# Patient Record
Sex: Male | Born: 2000
Health system: Southern US, Community
[De-identification: ages and names within clinical notes are randomized; demographics above are authoritative.]

## PROBLEM LIST (undated history)

## (undated) DIAGNOSIS — F909 Attention-deficit hyperactivity disorder, unspecified type: Secondary | ICD-10-CM

## (undated) DIAGNOSIS — Z8669 Personal history of other diseases of the nervous system and sense organs: Secondary | ICD-10-CM

## (undated) HISTORY — PX: TYMPANOSTOMY TUBE PLACEMENT: SHX32

## (undated) HISTORY — PX: TONSILLECTOMY: SUR1361

---
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
## 2019-03-20 ENCOUNTER — Emergency Department (INDEPENDENT_AMBULATORY_CARE_PROVIDER_SITE_OTHER)
Admission: EM | Admit: 2019-03-20 | Discharge: 2019-03-20 | Disposition: A | Discharge status: Home / Self Care | Payer: Commercial Managed Care - PPO | Source: Home / Self Care

## 2019-03-20 ENCOUNTER — Other Ambulatory Visit: Payer: Self-pay

## 2019-03-20 ENCOUNTER — Encounter: Payer: Self-pay | Admitting: Emergency Medicine

## 2019-03-20 DIAGNOSIS — H65193 Other acute nonsuppurative otitis media, bilateral: Secondary | ICD-10-CM

## 2019-03-20 MED ORDER — AMOXICILLIN 500 MG PO CAPS: 500.0000 mg | ORAL_CAPSULE | Freq: Three times a day (TID) | ORAL | 0 refills | Status: DC

## 2019-03-20 NOTE — ED Triage Notes (Signed)
Bilateral ear pain x 3-4 days   

## 2019-03-20 NOTE — ED Provider Notes (Signed)
[facility]    CSN: [id number] Arrival date & time: 03/20/19  1415     History   Chief Complaint Chief Complaint  Patient presents with   Otalgia    HPI Franklin Bowman is a 18 y.Bowman. male.   HPI  Franklin Bowman is a 18 y.Bowman. male presenting to UC with c/Bowman bilateral ear fullness and mild sharp pain on occasion for about 5-7 days, gradually worsening. Hx of ear infections in the past, symptoms feel similar. Denies fever, chills, n/v/d. He has taken Zyrtec w/Bowman relief. He has had multiple sets of ear tubes in the past due to ear infections.    Past Medical History:  Diagnosis Date   ADHD (attention deficit hyperactivity disorder)     Patient Active Problem List   Diagnosis Date Noted   Carpal tunnel syndrome on right 07/18/2015   Sinus tachycardia 07/18/2015    Past Surgical History:  Procedure Laterality Date   TONSILLECTOMY         Home Medications    Prior to Admission medications   Medication Sig Start Date End Date Taking? Authorizing Provider  cetirizine (ZYRTEC) 10 MG tablet Take 10 mg by mouth daily.   Yes [provider]  amoxicillin (AMOXIL) 500 MG capsule Take 1 capsule (500 mg total) by mouth 3 (three) times daily. 03/20/19   Franklin Bowman, Franklin Lor Bowman, Franklin Bowman    Family History History reviewed. No pertinent family history.  Social History Social History   Tobacco Use   Smoking status: Never Smoker   Smokeless tobacco: Never Used  Substance Use Topics   Alcohol use: No   Drug use: Never     Allergies   Labetalol hcl   Review of Systems Review of Systems  Constitutional: Negative for chills and fever.  HENT: Positive for congestion (minimal), ear pain ( bilateral) and hearing loss (muffled in both ears). Negative for sore throat, trouble swallowing and voice change.   Respiratory: Negative for cough and shortness of breath.   Cardiovascular: Negative for chest pain and palpitations.  Gastrointestinal: Negative for abdominal  pain, diarrhea, nausea and vomiting.  Musculoskeletal: Negative for arthralgias, back pain and myalgias.  Skin: Negative for rash.     Physical Exam Triage Vital Signs ED Triage Vitals  Enc Vitals Group     BP      Pulse      Resp      Temp      Temp src      SpO2      Weight      Height      Head Circumference      Peak Flow      Pain Score      Pain Loc      Pain Edu?      Excl. in [place]?    No data found.  Updated Vital Signs BP 139/79 (BP Location: Right Arm)    Pulse 94    Temp 98.4 F (36.9 C) (Oral)    Ht 5\' 10"  (1.778 m)    Wt 255 lb (115.7 kg)    SpO2 98%    BMI 36.59 kg/m   Visual Acuity Right Eye Distance:   Left Eye Distance:   Bilateral Distance:    Right Eye Near:   Left Eye Near:    Bilateral Near:     Physical Exam Vitals signs and nursing note reviewed.  Constitutional:      Appearance: Normal appearance. He is well-developed.  HENT:  Head: Normocephalic and atraumatic.     Right Ear: A middle ear effusion is present. Tympanic membrane is scarred. Tympanic membrane is not erythematous or bulging.     Left Ear: A middle ear effusion is present. Tympanic membrane is injected and scarred. Tympanic membrane is not erythematous or bulging.     Nose: Nose normal.     Right Sinus: No maxillary sinus tenderness or frontal sinus tenderness.     Left Sinus: No maxillary sinus tenderness or frontal sinus tenderness.     Mouth/Throat:     Lips: Pink.     Mouth: Mucous membranes are moist.     Pharynx: Oropharynx is clear. Uvula midline.  Neck:     Musculoskeletal: Normal range of motion.  Cardiovascular:     Rate and Rhythm: Normal rate and regular rhythm.  Pulmonary:     Effort: Pulmonary effort is normal. No respiratory distress.     Breath sounds: Normal breath sounds. No stridor. No wheezing or rhonchi.  Musculoskeletal: Normal range of motion.  Skin:    General: Skin is warm and dry.  Neurological:     Mental Status: He is alert and  oriented to person, place, and time.  Psychiatric:        Behavior: Behavior normal.      UC Treatments / Results  Labs (all labs ordered are listed, but only abnormal results are displayed) Labs Reviewed - No data to display  EKG None  Radiology No results found.  Procedures Procedures (including critical care time)  Medications Ordered in UC Medications - No data to display  Initial Impression / Assessment and Plan / UC Course  I have reviewed the triage vital signs and the nursing notes.  Pertinent labs & imaging results that were available during my care of the patient were reviewed by me and considered in my medical decision making (see chart for details).     Hx and exam c/w bilateral AOM Will start pt on amoxicillin Home care info provided.  Final Clinical Impressions(s) / UC Diagnoses   Final diagnoses:  Acute middle ear effusion, bilateral     Discharge Instructions      Please take antibiotics as prescribed and be sure to complete entire course even if you start to feel better to ensure infection does not come back.  You may take 500mg  acetaminophen every 4-6 hours or in combination with ibuprofen 400-600mg  every 6-8 hours as needed for pain, inflammation, and fever.  Be sure to well hydrated with clear liquids and get at least 8 hours of sleep at night, preferably more while sick.   Please follow up with family medicine in 1 week if needed.     ED Prescriptions    Medication Sig Dispense Auth. Provider   amoxicillin (AMOXIL) 500 MG capsule  (Status: Discontinued) Take 1 capsule (500 mg total) by mouth 3 (three) times daily. 21 capsule Franklin Bowman, Franklin Arvin Bowman, Franklin Bowman   amoxicillin (AMOXIL) 500 MG capsule Take 1 capsule (500 mg total) by mouth 3 (three) times daily. 21 capsule Franklin Bowman, Franklin Bowman     Controlled Substance Prescriptions [place] Controlled Substance Registry consulted? Not Applicable   Franklin Bowman 03/20/19 1441

## 2019-03-20 NOTE — Discharge Instructions (Signed)

## 2019-04-30 ENCOUNTER — Other Ambulatory Visit: Payer: Self-pay

## 2019-04-30 ENCOUNTER — Encounter: Payer: Self-pay | Admitting: Emergency Medicine

## 2019-04-30 ENCOUNTER — Emergency Department (INDEPENDENT_AMBULATORY_CARE_PROVIDER_SITE_OTHER)
Admission: EM | Admit: 2019-04-30 | Discharge: 2019-04-30 | Disposition: A | Discharge status: Home / Self Care | Payer: Commercial Managed Care - PPO | Source: Home / Self Care | Attending: Family Medicine | Admitting: Family Medicine

## 2019-04-30 DIAGNOSIS — R110 Nausea: Secondary | ICD-10-CM | POA: Diagnosis not present

## 2019-04-30 DIAGNOSIS — R197 Diarrhea, unspecified: Secondary | ICD-10-CM

## 2019-04-30 NOTE — Discharge Instructions (Signed)
°  Be sure to get a lot of rest and stay well hydrated with sports drinks, water, diluted juices, and clear sodas.  Avoid fried fatty food, spicy food, and milk as these foods can cause worsening stomach upset.   Please follow up with family medicine later this week as needed.

## 2019-04-30 NOTE — ED Triage Notes (Signed)
Patient states he had [place] food last evening and later he had 2-3 times; no nausea; no vomiting; no fever. Today he has some epigastric tenderness and mild headache and he has not tried any OTC. He has not travelled past 4 weeks; he works with public and uses mask.

## 2019-04-30 NOTE — ED Provider Notes (Signed)
[facility]    CSN: [id number] Arrival date & time: 04/30/19  1706     History   Chief Complaint Chief Complaint  Patient presents with  . Diarrhea  . Headache    HPI Franklin Bowman is a 18 y.o. male.   HPI Franklin Bowman is a 18 y.o. male presenting to UC with c/o having 2-3 episodes of diarrhea last night after eating [place] food. He did take peptobismol last night with relief. This morning he had to call out of work due to having a mild headache and some epigastric discomfort.  Pt denies nausea or vomiting. No medication taken today. He was able to eat [employer] earlier today and did not have n/v/d after eating lunch.  Denies fever, chills, cough, congestion. No diarrhea today. No sick contacts or recent travel.    Past Medical History:  Diagnosis Date  . ADHD (attention deficit hyperactivity disorder)     Patient Active Problem List   Diagnosis Date Noted  . Carpal tunnel syndrome on right 07/18/2015  . Sinus tachycardia 07/18/2015    Past Surgical History:  Procedure Laterality Date  . TONSILLECTOMY         Home Medications    Prior to Admission medications   Medication Sig Start Date End Date Taking? Authorizing Provider  amoxicillin (AMOXIL) 500 MG capsule Take 1 capsule (500 mg total) by mouth 3 (three) times daily. 03/20/19   Noe Gens, PA-C  cetirizine (ZYRTEC) 10 MG tablet Take 10 mg by mouth daily.    [provider]    Family History No family history on file.  Social History Social History   Tobacco Use  . Smoking status: Never Smoker  . Smokeless tobacco: Never Used  Substance Use Topics  . Alcohol use: No  . Drug use: Never     Allergies   Labetalol hcl   Review of Systems Review of Systems  Constitutional: Negative for appetite change, chills, fatigue and fever.  HENT: Negative for congestion.   Respiratory: Negative for cough and chest tightness.   Gastrointestinal: Positive for abdominal pain and  diarrhea. Negative for blood in stool, constipation, nausea and vomiting.  Genitourinary: Negative for dysuria, flank pain, frequency and hematuria.  Neurological: Positive for headaches. Negative for dizziness and light-headedness.     Physical Exam Triage Vital Signs ED Triage Vitals  Enc Vitals Group     BP 04/30/19 1736 (!) 148/86     Pulse Rate 04/30/19 1736 84     Resp 04/30/19 1736 16     Temp 04/30/19 1736 98.4 F (36.9 C)     Temp Source 04/30/19 1736 Oral     SpO2 04/30/19 1736 98 %     Weight 04/30/19 1737 253 lb 8.5 oz (115 kg)     Height 04/30/19 1737 5\' 10"  (1.778 m)     Head Circumference --      Peak Flow --      Pain Score 04/30/19 1737 1     Pain Loc --      Pain Edu? --      Excl. in [place]? --    No data found.  Updated Vital Signs BP (!) 148/86 (BP Location: Right Arm)   Pulse 84   Temp 98.4 F (36.9 C) (Oral)   Resp 16   Ht 5\' 10"  (1.778 m)   Wt 253 lb 8.5 oz (115 kg)   SpO2 98%   BMI 36.38 kg/m   Visual Acuity Right Eye  Distance:   Left Eye Distance:   Bilateral Distance:    Right Eye Near:   Left Eye Near:    Bilateral Near:     Physical Exam Vitals signs and nursing note reviewed.  Constitutional:      Appearance: He is well-developed.  HENT:     Head: Normocephalic and atraumatic.     Right Ear: Tympanic membrane normal.     Left Ear: Tympanic membrane normal.     Nose: Nose normal.     Right Sinus: No maxillary sinus tenderness or frontal sinus tenderness.     Left Sinus: No maxillary sinus tenderness or frontal sinus tenderness.     Mouth/Throat:     Lips: Pink.     Mouth: Mucous membranes are moist.     Pharynx: Oropharynx is clear. Uvula midline.  Neck:     Musculoskeletal: Normal range of motion and neck supple.  Cardiovascular:     Rate and Rhythm: Normal rate and regular rhythm.  Pulmonary:     Effort: Pulmonary effort is normal. No respiratory distress.     Breath sounds: Normal breath sounds.  Abdominal:      General: There is no distension.     Palpations: Abdomen is soft.     Tenderness: There is no abdominal tenderness.  Musculoskeletal: Normal range of motion.  Skin:    General: Skin is warm and dry.  Neurological:     Mental Status: He is alert and oriented to person, place, and time.  Psychiatric:        Behavior: Behavior normal.      UC Treatments / Results  Labs (all labs ordered are listed, but only abnormal results are displayed) Labs Reviewed - No data to display  EKG   Radiology No results found.  Procedures Procedures (including critical care time)  Medications Ordered in UC Medications - No data to display  Initial Impression / Assessment and Plan / UC Course  I have reviewed the triage vital signs and the nursing notes.  Pertinent labs & imaging results that were available during my care of the patient were reviewed by me and considered in my medical decision making (see chart for details).     Pt reports 2-3 episodes of diarrhea last night, which has since resolved. Pt requesting work note benign abdominal exam. Encouraged good hydration. Work note provided.  Final Clinical Impressions(s) / UC Diagnoses   Final diagnoses:  Nausea without vomiting  Diarrhea, unspecified type     Discharge Instructions      Be sure to get a lot of rest and stay well hydrated with sports drinks, water, diluted juices, and clear sodas.  Avoid fried fatty food, spicy food, and milk as these foods can cause worsening stomach upset.   Please follow up with family medicine later this week as needed.    ED Prescriptions    None     Controlled Substance Prescriptions  Controlled Substance Registry consulted? Not Applicable   Rolla Platehelps, Versia Mignogna O, PA-C 05/01/19 [id number]

## 2019-05-02 ENCOUNTER — Other Ambulatory Visit: Payer: Self-pay

## 2019-05-02 ENCOUNTER — Emergency Department (INDEPENDENT_AMBULATORY_CARE_PROVIDER_SITE_OTHER)
Admission: EM | Admit: 2019-05-02 | Discharge: 2019-05-02 | Disposition: A | Discharge status: Home / Self Care | Payer: Commercial Managed Care - PPO | Source: Home / Self Care | Attending: Emergency Medicine | Admitting: Emergency Medicine

## 2019-05-02 ENCOUNTER — Emergency Department (INDEPENDENT_AMBULATORY_CARE_PROVIDER_SITE_OTHER): Payer: Commercial Managed Care - PPO

## 2019-05-02 DIAGNOSIS — S9031XA Contusion of right foot, initial encounter: Secondary | ICD-10-CM

## 2019-05-02 DIAGNOSIS — S93401A Sprain of unspecified ligament of right ankle, initial encounter: Secondary | ICD-10-CM

## 2019-05-02 DIAGNOSIS — M79671 Pain in right foot: Secondary | ICD-10-CM | POA: Diagnosis not present
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
## 2019-05-02 NOTE — Discharge Instructions (Signed)
Diagnosis is right ankle sprain and right foot contusion.  No evidence of fracture on x-rays. Treatment: Please see attached instruction sheets on foot contusion and ankle sprain. Ice and elevate for next 1 to 2 days.  Then may try heat.  May use Ace bandage for 1 to 2 days. Wear the right ankle brace that were applying today.  Use crutches that you have.  Over the next few days may gradually try to increase walking a little bit. I know you have meloxicam 15 mg at home, may take this once a day with food for moderate pain. I am printing a separate work note, excusing from work for 1 week, as you are required to be on your feet all day.

## 2019-06-18 ENCOUNTER — Other Ambulatory Visit: Payer: Self-pay

## 2019-06-18 ENCOUNTER — Emergency Department (INDEPENDENT_AMBULATORY_CARE_PROVIDER_SITE_OTHER)
Admission: EM | Admit: 2019-06-18 | Discharge: 2019-06-18 | Disposition: A | Discharge status: Home / Self Care | Payer: Commercial Managed Care - PPO | Source: Home / Self Care | Attending: Family Medicine | Admitting: Family Medicine

## 2019-06-18 DIAGNOSIS — R110 Nausea: Secondary | ICD-10-CM | POA: Diagnosis not present

## 2019-06-18 MED ORDER — ONDANSETRON 4 MG PO TBDP: ORAL_TABLET | ORAL | 0 refills | Status: DC

## 2019-06-18 NOTE — Discharge Instructions (Signed)
Isolate yourself until the below conditions are met: 1)  At least 7 days since symptoms onset. AND 2)  > 72 hours after symptom resolution (absence of fever without the use of fever-reducing medicine, and improvement in any respiratory symptoms.  If COVID test negative, may resume activities as tolerated.. If symptoms become significantly worse during the night or over the weekend, proceed to the local emergency room.

## 2019-06-18 NOTE — ED Triage Notes (Signed)
Headache and nausea for the last few days.  No emesis.  Fatigued.  Denies fever or cough

## 2019-06-18 NOTE — ED Provider Notes (Signed)
[facility]    CSN: [id number] Arrival date & time: 06/18/19  1057      History   Chief Complaint Chief Complaint  Patient presents with  . Nausea  . Headache    HPI Franklin Bowman is a 18 y.o. male.   Patient developed nausea without vomiting three days ago, followed by mild headache and fatigue. He denies cough or respiratory symptoms, and no changes in taste/smell.  He feels better today.  The history is provided by the patient.    Past Medical History:  Diagnosis Date  . ADHD (attention deficit hyperactivity disorder)     Patient Active Problem List   Diagnosis Date Noted  . Carpal tunnel syndrome on right 07/18/2015  . Sinus tachycardia 07/18/2015    Past Surgical History:  Procedure Laterality Date  . TONSILLECTOMY         Home Medications    Prior to Admission medications   Medication Sig Start Date End Date Taking? Authorizing Provider  ondansetron (ZOFRAN ODT) 4 MG disintegrating tablet Take one tab by mouth Q6hr prn nausea.  Dissolve under tongue. 06/18/19   Kandra Nicolas, MD    Family History History reviewed. No pertinent family history.  Social History Social History   Tobacco Use  . Smoking status: Never Smoker  . Smokeless tobacco: Never Used  Substance Use Topics  . Alcohol use: No  . Drug use: Never     Allergies   Labetalol hcl   Review of Systems Review of Systems + sore throat, resolved No cough No pleuritic pain No wheezing No nasal congestion No post-nasal drainage No sinus pain/pressure No itchy/red eyes No earache No hemoptysis No SOB No fever/chills + nausea No vomiting No abdominal pain No diarrhea No urinary symptoms No skin rash + fatigue No myalgias + headache   Physical Exam Triage Vital Signs ED Triage Vitals  Enc Vitals Group     BP 06/18/19 1201 (!) 147/86     Pulse Rate 06/18/19 1201 99     Resp 06/18/19 1201 20     Temp 06/18/19 1201 97.7 F (36.5 C)     Temp Source  06/18/19 1201 Oral     SpO2 06/18/19 1201 99 %     Weight 06/18/19 1202 252 lb (114.3 kg)     Height 06/18/19 1202 _0  (1.778 m)     Head Circumference --      Peak Flow --      Pain Score 06/18/19 1202 4     Pain Loc --      Pain Edu? --      Excl. in [place]? --    No data found.  Updated Vital Signs BP (!) 147/86 (BP Location: Right Arm)   Pulse 99   Temp 97.7 F (36.5 C) (Oral)   Resp 20   Ht _1  (1.778 m)   Wt 114.3 kg   SpO2 99%   BMI 36.16 kg/m   Visual Acuity Right Eye Distance:   Left Eye Distance:   Bilateral Distance:    Right Eye Near:   Left Eye Near:    Bilateral Near:     Physical Exam Nursing notes and Vital Signs reviewed. Appearance:  Patient appears stated age, and in no acute distress Eyes:  Pupils are equal, round, and reactive to light and accomodation.  Extraocular movement is intact.  Conjunctivae are not inflamed  Ears:  Canals normal.  Tympanic membranes normal.  Nose:  Mildly congested turbinates.  No sinus tenderness. Pharynx:  Normal; moist mucous membranes  Neck:  Supple. No adenopathy   Lungs:  Clear to auscultation.  Breath sounds are equal.  Moving air well. Heart:  Regular rate and rhythm without murmurs, rubs, or gallops.  Abdomen:  Nontender without masses or hepatosplenomegaly.  Bowel sounds are present.  No CVA or flank tenderness.  Extremities:  No edema.  Skin:  No rash present.    UC Treatments / Results  Labs (all labs ordered are listed, but only abnormal results are displayed) Labs Reviewed  NOVEL CORONAVIRUS, NAA    EKG   Radiology No results found.  Procedures Procedures (including critical care time)  Medications Ordered in UC Medications - No data to display  Initial Impression / Assessment and Plan / UC Course  I have reviewed the triage vital signs and the nursing notes.  Pertinent labs & imaging results that were available during my care of the patient were reviewed by me and considered in my  medical decision making (see chart for details).    Suspect viral syndrome.  COVID19 test pending. Rx for Zofran ODT 56m.   Final Clinical Impressions(s) / UC Diagnoses   Final diagnoses:  Nausea without vomiting     Discharge Instructions      Isolate yourself until the below conditions are met: 1)  At least 7 days since symptoms onset. AND 2)  > 72 hours after symptom resolution (absence of fever without the use of fever-reducing medicine, and improvement in any respiratory symptoms.  If COVID test negative, may resume activities as tolerated.. If symptoms become significantly worse during the night or over the weekend, proceed to the local emergency room.      ED Prescriptions    Medication Sig Dispense Auth. Provider   ondansetron (ZOFRAN ODT) 4 MG disintegrating tablet Take one tab by mouth Q6hr prn nausea.  Dissolve under tongue. 12 tablet BKandra Nicolas MD        BKandra Nicolas MD 06/21/19 [id number]

## 2019-06-19 LAB — NOVEL CORONAVIRUS, NAA: SARS-CoV-2, NAA: NOT DETECTED

## 2019-06-25 ENCOUNTER — Encounter: Payer: Self-pay | Admitting: *Deleted

## 2019-06-25 ENCOUNTER — Emergency Department (INDEPENDENT_AMBULATORY_CARE_PROVIDER_SITE_OTHER)
Admission: EM | Admit: 2019-06-25 | Discharge: 2019-06-25 | Disposition: A | Discharge status: Home / Self Care | Payer: Commercial Managed Care - PPO | Source: Home / Self Care

## 2019-06-25 ENCOUNTER — Other Ambulatory Visit: Payer: Self-pay

## 2019-06-25 DIAGNOSIS — R197 Diarrhea, unspecified: Secondary | ICD-10-CM

## 2019-06-25 DIAGNOSIS — L298 Other pruritus: Secondary | ICD-10-CM

## 2019-06-25 DIAGNOSIS — R519 Headache, unspecified: Secondary | ICD-10-CM

## 2019-06-25 DIAGNOSIS — J029 Acute pharyngitis, unspecified: Secondary | ICD-10-CM

## 2019-06-25 DIAGNOSIS — R110 Nausea: Secondary | ICD-10-CM

## 2019-06-25 LAB — POCT RAPID STREP A (OFFICE): Rapid Strep A Screen: NEGATIVE

## 2019-06-25 MED ORDER — TRIAMCINOLONE ACETONIDE 0.1 % EX CREA: 1.0000 "application " | TOPICAL_CREAM | Freq: Two times a day (BID) | CUTANEOUS | 0 refills | Status: DC

## 2019-06-25 NOTE — ED Triage Notes (Signed)
Pt c/o sore throat, nausea, rash on RT forearm, diarrhea, HA, and fatigue x 06/15/19.

## 2019-06-25 NOTE — Discharge Instructions (Signed)
°  You may take 500mg  acetaminophen every 4-6 hours or in combination with ibuprofen 400-600mg  every 6-8 hours as needed for pain, inflammation, and fever.  Be sure to well hydrated with clear liquids and get at least 8 hours of sleep at night, preferably more while sick.   Please follow up with family medicine in 1 week if needed.  Be sure to get a lot of rest and stay well hydrated with sports drinks, water, diluted juices, and clear sodas.  Avoid fried fatty food, spicy food, and milk as these foods can cause worsening stomach upset.   No Primary Care Doctor: Call [facility] at  [phone] - they can help you locate a primary care doctor that  accepts your insurance, provides certain services, etc. Physician Referral Service- [phone]

## 2019-06-25 NOTE — ED Provider Notes (Signed)
[facility]    CSN: [id number] Arrival date & time: 06/25/19  1002      History   Chief Complaint Chief Complaint  Patient presents with  . Sore Throat  . Nausea    HPI Franklin Bowman is a 18 y.o. male.   HPI Franklin Bowman is a 18 y.o. male presenting to UC with c/o sore throat that started 2 days ago along with a mildly itchy red rash on his Right forearm. He was seen at [facility] on [facility]/18/2019, tested negative for Covid-19 at that time.  He was only having nausea, fatigue, and a mild HA at that time so a strep test was not ordered then.  The zofran prescribed has helped but he reports having diarrhea the last few days and continued fatigue. Pt concerned his covid test was not accurate. Denies fever, chills, cough, congestion, or vomiting. He has been taking pepto-bismol and notes his stool has been darker than usual. No sick contacts or recent travel.   Past Medical History:  Diagnosis Date  . ADHD (attention deficit hyperactivity disorder)     Patient Active Problem List   Diagnosis Date Noted  . Carpal tunnel syndrome on right 07/18/2015  . Sinus tachycardia 07/18/2015    Past Surgical History:  Procedure Laterality Date  . TONSILLECTOMY         Home Medications    Prior to Admission medications   Medication Sig Start Date End Date Taking? Authorizing Provider  ondansetron (ZOFRAN ODT) 4 MG disintegrating tablet Take one tab by mouth Q6hr prn nausea.  Dissolve under tongue. 06/18/19   Kandra Nicolas, MD  triamcinolone cream (KENALOG) 0.1 % Apply 1 application topically 2 (two) times daily. 06/25/19   Noe Gens, PA-C    Family History History reviewed. No pertinent family history.  Social History Social History   Tobacco Use  . Smoking status: Never Smoker  . Smokeless tobacco: Never Used  Substance Use Topics  . Alcohol use: No  . Drug use: Never     Allergies   Labetalol hcl   Review of Systems Review of Systems  Constitutional:  Positive for fatigue. Negative for chills and fever.  HENT: Positive for sore throat. Negative for congestion, ear pain, trouble swallowing and voice change.   Respiratory: Negative for cough and shortness of breath.   Cardiovascular: Negative for chest pain and palpitations.  Gastrointestinal: Positive for diarrhea and nausea. Negative for abdominal pain and vomiting.  Musculoskeletal: Negative for arthralgias, back pain and myalgias.  Skin: Positive for rash.  Neurological: Positive for headaches. Negative for dizziness and light-headedness.     Physical Exam Triage Vital Signs ED Triage Vitals  Enc Vitals Group     BP 06/25/19 1015 (!) 143/84     Pulse Rate 06/25/19 1015 80     Resp 06/25/19 1015 18     Temp 06/25/19 1015 98.9 F (37.2 C)     Temp Source 06/25/19 1015 Oral     SpO2 06/25/19 1015 98 %     Weight 06/25/19 1014 252 lb (114.3 kg)     Height 06/25/19 1014 5\' 10"  (1.778 m)     Head Circumference --      Peak Flow --      Pain Score 06/25/19 1014 0     Pain Loc --      Pain Edu? --      Excl. in [place]? --    No data found.  Updated Vital Signs BP Marland Kitchen)  143/84 (BP Location: Right Arm)   Pulse 80   Temp 98.9 F (37.2 C) (Oral)   Resp 18   Ht 5\' 10"  (1.778 m)   Wt 252 lb (114.3 kg)   SpO2 98%   BMI 36.16 kg/m   Visual Acuity Right Eye Distance:   Left Eye Distance:   Bilateral Distance:    Right Eye Near:   Left Eye Near:    Bilateral Near:     Physical Exam Vitals signs and nursing note reviewed.  Constitutional:      General: He is not in acute distress.    Appearance: He is well-developed. He is not ill-appearing, toxic-appearing or diaphoretic.  HENT:     Head: Normocephalic and atraumatic.     Right Ear: Tympanic membrane normal.     Left Ear: Tympanic membrane normal.     Nose: Nose normal.     Right Sinus: No maxillary sinus tenderness or frontal sinus tenderness.     Left Sinus: No maxillary sinus tenderness or frontal sinus tenderness.      Mouth/Throat:     Lips: Pink.     Mouth: Mucous membranes are moist.     Pharynx: Oropharynx is clear. Uvula midline. No pharyngeal swelling, oropharyngeal exudate, posterior oropharyngeal erythema or uvula swelling.  Neck:     Musculoskeletal: Normal range of motion and neck supple.  Cardiovascular:     Rate and Rhythm: Normal rate and regular rhythm.  Pulmonary:     Effort: Pulmonary effort is normal. No respiratory distress.     Breath sounds: Normal breath sounds. No stridor. No wheezing or rhonchi.  Abdominal:     General: There is no distension.     Palpations: Abdomen is soft.     Tenderness: There is no abdominal tenderness.  Musculoskeletal: Normal range of motion.  Lymphadenopathy:     Cervical: No cervical adenopathy.  Skin:    General: Skin is warm and dry.     Findings: Rash present.       Neurological:     Mental Status: He is alert and oriented to person, place, and time.  Psychiatric:        Behavior: Behavior normal.      UC Treatments / Results  Labs (all labs ordered are listed, but only abnormal results are displayed) Labs Reviewed  STREP A DNA PROBE  POCT RAPID STREP A (OFFICE)    EKG   Radiology No results found.  Procedures Procedures (including critical care time)  Medications Ordered in UC Medications - No data to display  Initial Impression / Assessment and Plan / UC Course  I have reviewed the triage vital signs and the nursing notes.  Pertinent labs & imaging results that were available during my care of the patient were reviewed by me and considered in my medical decision making (see chart for details).     Reassured pt of normal strep Discussed mono given HA, fatigue and sore throat- no hx of mono, pt declined further testing. Encouraged symptomatic tx F/u with PCP as needed.   Final Clinical Impressions(s) / UC Diagnoses   Final diagnoses:  Acute pharyngitis, unspecified etiology  Nausea without vomiting  Generalized  headache  Pruritic erythematous rash  Diarrhea, unspecified type     Discharge Instructions      You may take 500mg  acetaminophen every 4-6 hours or in combination with ibuprofen 400-600mg  every 6-8 hours as needed for pain, inflammation, and fever.  Be sure to well hydrated with clear  liquids and get at least 8 hours of sleep at night, preferably more while sick.   Please follow up with family medicine in 1 week if needed.  Be sure to get a lot of rest and stay well hydrated with sports drinks, water, diluted juices, and clear sodas.  Avoid fried fatty food, spicy food, and milk as these foods can cause worsening stomach upset.   No Primary Care Doctor: - Call [facility] at  [phone] - they can help you locate a primary care doctor that  accepts your insurance, provides certain services, etc. - Physician Referral [phone]- [phone]      ED Prescriptions    Medication Sig Dispense Auth. Provider   triamcinolone cream (KENALOG) 0.1 % Apply 1 application topically 2 (two) times daily. 30 g Lurene ShadowPhelps, Amylia Collazos O, [place]-C     PDMP not reviewed this encounter.   Lurene Shadowhelps, Michon Kaczmarek O, [place]-C 06/25/19 1240

## 2019-06-27 LAB — STREP A DNA PROBE: Group A Strep Probe: NOT DETECTED

## 2020-01-07 ENCOUNTER — Other Ambulatory Visit: Payer: Self-pay

## 2020-01-07 ENCOUNTER — Encounter: Payer: Self-pay | Admitting: Emergency Medicine

## 2020-01-07 ENCOUNTER — Emergency Department (INDEPENDENT_AMBULATORY_CARE_PROVIDER_SITE_OTHER)
Admission: EM | Admit: 2020-01-07 | Discharge: 2020-01-07 | Disposition: A | Discharge status: Home / Self Care | Payer: Commercial Managed Care - PPO | Source: Home / Self Care

## 2020-01-07 DIAGNOSIS — M546 Pain in thoracic spine: Secondary | ICD-10-CM

## 2020-01-07 MED ORDER — MELOXICAM 15 MG PO TBDP
1.0000 | ORAL_TABLET | Freq: Every day | ORAL | 0 refills | Status: DC | PRN
Start: 2020-01-07 — End: 2020-01-08

## 2020-01-07 MED ORDER — CYCLOBENZAPRINE HCL 5 MG PO TABS: 5.0000 mg | ORAL_TABLET | Freq: Two times a day (BID) | ORAL | 0 refills | Status: DC | PRN

## 2020-01-07 NOTE — Discharge Instructions (Signed)
°  Flexeril (cyclobenzaprine) is a muscle relaxer and may cause drowsiness. Do not drink alcohol, drive, or operate heavy machinery while taking. ° °Meloxicam (Mobic) is an antiinflammatory to help with pain and inflammation.  Do not take ibuprofen, Advil, Aleve, or any other medications that contain NSAIDs while taking meloxicam as this may cause stomach upset or even ulcers if taken in large amounts for an extended period of time.  ° °

## 2020-01-07 NOTE — ED Provider Notes (Signed)
[facility]    CSN: [id number] Arrival date & time: 01/07/20  1131      History   Chief Complaint Chief Complaint  Patient presents with  . Back Pain    r upper back    HPI Denvil Canning is a 19 y.o. male.   HPI Miliano Cotten is a 19 y.o. male presenting to UC with c/o Right upper back pain around his scapula that started this morning after waking up. Pain is sharp, 10/10. Denies radiation of pain or numbness in arms or legs. He is right hand dominant.  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 Pain was worse at work today, causing him to leave early.  Hx of similar pain on the left side of his back a few weeks ago. He took meloxicam at that time but was unsure if he should take the same medication today for the right side of his back. He called Dr. Dianah Field, who he's seen in the past, but no appointments were available today.    Past Medical History:  Diagnosis Date  . ADHD (attention deficit hyperactivity disorder)     Patient Active Problem List   Diagnosis Date Noted  . Carpal tunnel syndrome on right 07/18/2015  . Sinus tachycardia 07/18/2015    Past Surgical History:  Procedure Laterality Date  . TONSILLECTOMY         Home Medications    Prior to Admission medications   Medication Sig Start Date End Date Taking? Authorizing Provider  cetirizine (ZYRTEC) 10 MG tablet Take by mouth.   Yes [provider]  cyclobenzaprine (FLEXERIL) 5 MG tablet Take 1-2 tablets (5-10 mg total) by mouth 2 (two) times daily as needed for muscle spasms. 01/07/20   Noe Gens, PA-C  Meloxicam 15 MG TBDP Take 1 tablet by mouth daily as needed (pain and swelling). 01/07/20   Noe Gens, PA-C  ondansetron (ZOFRAN ODT) 4 MG disintegrating tablet Take one tab by mouth Q6hr prn nausea.  Dissolve under tongue. 06/18/19   Kandra Nicolas, MD  triamcinolone cream (KENALOG) 0.1 % Apply 1 application topically 2 (two) times daily. 06/25/19   Noe Gens,  PA-C    Family History Family History  Problem Relation Age of Onset  . Healthy Mother   . Healthy Father   . Healthy Sister     Social History Social History   Tobacco Use  . Smoking status: Never Smoker  . Smokeless tobacco: Never Used  Substance Use Topics  . Alcohol use: No  . Drug use: Never     Allergies   Labetalol hcl   Review of Systems Review of Systems  Musculoskeletal: Positive for back pain and myalgias. Negative for arthralgias, gait problem, joint swelling, neck pain and neck stiffness.  Skin: Negative for color change, rash and wound.  Neurological: Negative for weakness and numbness.     Physical Exam Triage Vital Signs ED Triage Vitals  Enc Vitals Group     BP 01/07/20 1143 135/83     Pulse Rate 01/07/20 1143 69     Resp --      Temp 01/07/20 1143 98.1 F (36.7 C)     Temp Source 01/07/20 1143 Oral     SpO2 01/07/20 1143 99 %     Weight --      Height --      Head Circumference --      Peak Flow --      Pain Score  01/07/20 1144 10     Pain Loc --      Pain Edu? --      Excl. in [place]? --    No data found.  Updated Vital Signs BP 135/83 (BP Location: Left Arm)   Pulse 69   Temp 98.1 F (36.7 C) (Oral)   SpO2 99%   Visual Acuity Right Eye Distance:   Left Eye Distance:   Bilateral Distance:    Right Eye Near:   Left Eye Near:    Bilateral Near:     Physical Exam Vitals and nursing note reviewed.  Constitutional:      Appearance: He is well-developed.  HENT:     Head: Normocephalic and atraumatic.  Neck:     Comments: No midline bone tenderness, no crepitus or step-offs.  Cardiovascular:     Rate and Rhythm: Normal rate.  Pulmonary:     Effort: Pulmonary effort is normal.  Musculoskeletal:        General: Tenderness present. Normal range of motion.     Cervical back: Normal range of motion and neck supple. No tenderness.       Back:     Comments: No midline spinal tenderness. Full ROM upper and lower extremities  with 5-5 strength. Tenderness to Right side thoracic muscles along border of scapula.   Skin:    General: Skin is warm and dry.  Neurological:     Mental Status: He is alert and oriented to person, place, and time.  Psychiatric:        Behavior: Behavior normal.      UC Treatments / Results  Labs (all labs ordered are listed, but only abnormal results are displayed) Labs Reviewed - No data to display  EKG   Radiology No results found.  Procedures Procedures (including critical care time)  Medications Ordered in UC Medications - No data to display  Initial Impression / Assessment and Plan / UC Course  I have reviewed the triage vital signs and the nursing notes.  Pertinent labs & imaging results that were available during my care of the patient were reviewed by me and considered in my medical decision making (see chart for details).     Hx and exam c/w thoracic muscle strain and spasms Will tx with mobic and flexeril encourgaed f/u with sports medicine as needed later this week. Work note provided for today AVS provided.  Final Clinical Impressions(s) / UC Diagnoses   Final diagnoses:  Acute right-sided thoracic back pain     Discharge Instructions      Flexeril (cyclobenzaprine) is a muscle relaxer and may cause drowsiness. Do not drink alcohol, drive, or operate heavy machinery while taking.  Meloxicam (Mobic) is an antiinflammatory to help with pain and inflammation.  Do not take ibuprofen, Advil, Aleve, or any other medications that contain NSAIDs while taking meloxicam as this may cause stomach upset or even ulcers if taken in large amounts for an extended period of time.      ED Prescriptions    Medication Sig Dispense Auth. Provider   cyclobenzaprine (FLEXERIL) 5 MG tablet Take 1-2 tablets (5-10 mg total) by mouth 2 (two) times daily as needed for muscle spasms. 30 tablet Waylan Rocher O, PA-C   Meloxicam 15 MG TBDP Take 1 tablet by mouth daily as  needed (pain and swelling). 30 tablet Lurene Shadow, [place]     I have reviewed the PDMP during this encounter.   Lurene Shadow, [place] 01/07/20 1319

## 2020-01-07 NOTE — ED Triage Notes (Signed)
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

## 2020-01-08 ENCOUNTER — Telehealth: Payer: Self-pay | Admitting: Emergency Medicine

## 2020-01-08 MED ORDER — MELOXICAM 15 MG PO TABS: 15.0000 mg | ORAL_TABLET | Freq: Every day | ORAL | 0 refills | Status: DC

## 2020-01-08 NOTE — Telephone Encounter (Signed)
Meloxicam re-ordered as it came up ODT initially for [facility]. Oral tablet intended for pt.

## 2020-02-18 ENCOUNTER — Encounter: Payer: Self-pay | Admitting: Sports Medicine

## 2020-02-18 ENCOUNTER — Ambulatory Visit (INDEPENDENT_AMBULATORY_CARE_PROVIDER_SITE_OTHER): Payer: Commercial Managed Care - PPO | Admitting: Sports Medicine

## 2020-02-18 ENCOUNTER — Other Ambulatory Visit: Payer: Self-pay

## 2020-02-18 DIAGNOSIS — M7552 Bursitis of left shoulder: Secondary | ICD-10-CM

## 2020-02-18 NOTE — Progress Notes (Signed)
    Procedures performed today:    Procedure: Real-time Ultrasound Guided injection of the left subacromial bursa Device: Samsung HS60  Verbal informed consent obtained.  Time-out conducted.  Noted no overlying erythema, induration, or other signs of local infection.  Skin prepped in a sterile fashion.  Local anesthesia: Topical Ethyl chloride.  With sterile technique and under real time ultrasound guidance: 1 cc Kenalog 40, 1 cc lidocaine,1 cc bupivacaine injected easily Completed without difficulty  Pain immediately resolved suggesting accurate placement of the medication.  Advised to call if fevers/chills, erythema, induration, drainage, or persistent bleeding.  Images permanently stored and available for review in the ultrasound unit.  Impression: Technically successful ultrasound guided injection.  Independent interpretation of notes and tests performed by another provider:   None.  Brief History, Exam, Impression, and Recommendations:    Acute shoulder bursitis, left Apolinar Junes returns he is a pleasant 19 year old male, of seen him before for other problems. Last Friday he was helping a friend change a tire, he turned and twisted and felt a sharp pain in his left shoulder. He localizes it over the deltoid and worse with abduction. He has a positive Neer, empty can, Hawkins signs on exam. He has tried meloxicam, relative rest. Today we are going to do a subacromial injection with guidance, rotator cuff rehab exercises given, light duty at work for a week, return to see me in a month.    ___________________________________________ Ihor Austin. Benjamin Stain, M.D., ABFM., CAQSM. Primary Care and Sports Medicine [facility]  Adjunct Instructor of Family Medicine  [employer]

## 2020-02-18 NOTE — Assessment & Plan Note (Signed)
Franklin Bowman returns he is a pleasant 19 year old male, of seen him before for other problems. Last Friday he was helping a friend change a tire, he turned and twisted and felt a sharp pain in his left shoulder. He localizes it over the deltoid and worse with abduction. He has a positive Neer, empty can, Hawkins signs on exam. He has tried meloxicam, relative rest. Today we are going to do a subacromial injection with guidance, rotator cuff rehab exercises given, light duty at work for a week, return to see me in a month.

## 2020-03-23 ENCOUNTER — Emergency Department (INDEPENDENT_AMBULATORY_CARE_PROVIDER_SITE_OTHER)
Admission: EM | Admit: 2020-03-23 | Discharge: 2020-03-23 | Disposition: A | Discharge status: Home / Self Care | Payer: Commercial Managed Care - PPO | Source: Home / Self Care

## 2020-03-23 ENCOUNTER — Encounter: Payer: Self-pay | Admitting: Emergency Medicine

## 2020-03-23 ENCOUNTER — Other Ambulatory Visit: Payer: Self-pay

## 2020-03-23 DIAGNOSIS — H6692 Otitis media, unspecified, left ear: Secondary | ICD-10-CM | POA: Diagnosis not present

## 2020-03-23 HISTORY — DX: Personal history of other diseases of the nervous system and sense organs: Z86.69

## 2020-03-23 MED ORDER — AMOXICILLIN-POT CLAVULANATE 875-125 MG PO TABS: 1.0000 | ORAL_TABLET | Freq: Two times a day (BID) | ORAL | 0 refills | Status: DC

## 2020-03-23 NOTE — Discharge Instructions (Signed)
  You may take 500mg acetaminophen every 4-6 hours or in combination with ibuprofen 400-600mg every 6-8 hours as needed for pain, inflammation, and fever.  Be sure to well hydrated with clear liquids and get at least 8 hours of sleep at night, preferably more while sick.   Please follow up with family medicine in 1 week if needed.   

## 2020-03-23 NOTE — ED Provider Notes (Signed)
[facility]    CSN: [id number] Arrival date & time: 03/23/20  1437      History   Chief Complaint Chief Complaint  Patient presents with  . Otalgia    bilateral    HPI Franklin Bowman is a 19 y.o. male.   HPI  Franklin Bowman is a 19 y.o. male presenting to UC with c/o bilateral ear pain that started 1 week ago after taking a shower. Pain is sharp and aching, 2/10, Left ear worsened after using a q-tip. No bleeding or drainage. Tylenol taken at 830AM this morning. Hx of chronic ear infections. No fever, chills, n/v/d.    Past Medical History:  Diagnosis Date  . ADHD (attention deficit hyperactivity disorder)   . History of frequent ear infections     Patient Active Problem List   Diagnosis Date Noted  . Acute shoulder bursitis, left 02/18/2020  . Carpal tunnel syndrome on right 07/18/2015  . Sinus tachycardia 07/18/2015    Past Surgical History:  Procedure Laterality Date  . TONSILLECTOMY    . TYMPANOSTOMY TUBE PLACEMENT Bilateral        Home Medications    Prior to Admission medications   Medication Sig Start Date End Date Taking? Authorizing Provider  cetirizine (ZYRTEC) 10 MG tablet Take by mouth.   Yes [provider]  amoxicillin-clavulanate (AUGMENTIN) 875-125 MG tablet Take 1 tablet by mouth 2 (two) times daily. One po bid x 7 days 03/23/20   Lurene Shadow, PA-C  meloxicam (MOBIC) 15 MG tablet Take 1 tablet (15 mg total) by mouth daily. 01/08/20   Lurene Shadow, PA-C    Family History Family History  Problem Relation Age of Onset  . Healthy Mother   . Healthy Father   . Healthy Sister     Social History Social History   Tobacco Use  . Smoking status: Never Smoker  . Smokeless tobacco: Never Used  Vaping Use  . Vaping Use: Never used  Substance Use Topics  . Alcohol use: No  . Drug use: Never     Allergies   Labetalol hcl   Review of Systems Review of Systems  Constitutional: Negative for chills and fever.    HENT: Positive for congestion and ear pain. Negative for sore throat, trouble swallowing and voice change.   Respiratory: Negative for cough and shortness of breath.   Cardiovascular: Negative for chest pain and palpitations.  Gastrointestinal: Negative for abdominal pain, diarrhea, nausea and vomiting.  Musculoskeletal: Negative for arthralgias, back pain and myalgias.  Skin: Negative for rash.  Neurological: Negative for dizziness and headaches.  All other systems reviewed and are negative.    Physical Exam Triage Vital Signs ED Triage Vitals  Enc Vitals Group     BP 03/23/20 1508 127/80     Pulse Rate 03/23/20 1508 81     Resp 03/23/20 1508 17     Temp 03/23/20 1508 98.8 F (37.1 C)     Temp Source 03/23/20 1508 Oral     SpO2 03/23/20 1508 96 %     Weight --      Height --      Head Circumference --      Peak Flow --      Pain Score 03/23/20 1510 3     Pain Loc --      Pain Edu? --      Excl. in GC? --    No data found.  Updated Vital Signs BP 127/80 (BP Location:  Left Arm)   Pulse 81   Temp 98.8 F (37.1 C) (Oral)   Resp 17   SpO2 96%   Visual Acuity Right Eye Distance:   Left Eye Distance:   Bilateral Distance:    Right Eye Near:   Left Eye Near:    Bilateral Near:     Physical Exam Vitals and nursing note reviewed.  Constitutional:      General: He is not in acute distress.    Appearance: Normal appearance. He is well-developed. He is not ill-appearing, toxic-appearing or diaphoretic.  HENT:     Head: Normocephalic and atraumatic.     Right Ear: No drainage or swelling. Tympanic membrane is erythematous. Tympanic membrane is not bulging.     Left Ear: Swelling and tenderness present. No drainage. Tympanic membrane is erythematous and bulging.     Nose: Nose normal.     Right Sinus: No maxillary sinus tenderness or frontal sinus tenderness.     Left Sinus: No maxillary sinus tenderness or frontal sinus tenderness.     Mouth/Throat:     Lips: Pink.      Mouth: Mucous membranes are moist.     Pharynx: Oropharynx is clear. Uvula midline.  Cardiovascular:     Rate and Rhythm: Normal rate and regular rhythm.  Pulmonary:     Effort: Pulmonary effort is normal. No respiratory distress.     Breath sounds: Normal breath sounds. No stridor. No wheezing, rhonchi or rales.  Musculoskeletal:        General: Normal range of motion.     Cervical back: Normal range of motion.  Skin:    General: Skin is warm and dry.  Neurological:     Mental Status: He is alert and oriented to person, place, and time.  Psychiatric:        Behavior: Behavior normal.      UC Treatments / Results  Labs (all labs ordered are listed, but only abnormal results are displayed) Labs Reviewed - No data to display  EKG   Radiology No results found.  Procedures Procedures (including critical care time)  Medications Ordered in UC Medications - No data to display  Initial Impression / Assessment and Plan / UC Course  I have reviewed the triage vital signs and the nursing notes.  Pertinent labs & imaging results that were available during my care of the patient were reviewed by me and considered in my medical decision making (see chart for details).    Hx and exam c/w Left AOM  Will tx with Augmentin AVS given  Final Clinical Impressions(s) / UC Diagnoses   Final diagnoses:  Left acute otitis media     Discharge Instructions      You may take 500mg  acetaminophen every 4-6 hours or in combination with ibuprofen 400-600mg  every 6-8 hours as needed for pain, inflammation, and fever.  Be sure to well hydrated with clear liquids and get at least 8 hours of sleep at night, preferably more while sick.   Please follow up with family medicine in 1 week if needed.     ED Prescriptions    Medication Sig Dispense Auth. Provider   amoxicillin-clavulanate (AUGMENTIN) 875-125 MG tablet Take 1 tablet by mouth 2 (two) times daily. One po bid x 7 days 14  tablet Noe Gens, [place]     PDMP not reviewed this encounter.   Noe Gens, [place] 03/23/20 1549

## 2020-03-23 NOTE — ED Triage Notes (Signed)
Bilat ear pain started on Monday after shower Sharp pain to left ear after using a q-tip No drainage to ears OTC tylenol today at 0830  Chronic ear infections w/ear tubes as a child No COVID vaccine

## 2020-04-01 ENCOUNTER — Ambulatory Visit (INDEPENDENT_AMBULATORY_CARE_PROVIDER_SITE_OTHER): Payer: Commercial Managed Care - PPO

## 2020-04-01 ENCOUNTER — Ambulatory Visit (INDEPENDENT_AMBULATORY_CARE_PROVIDER_SITE_OTHER): Payer: Commercial Managed Care - PPO | Admitting: Sports Medicine

## 2020-04-01 ENCOUNTER — Other Ambulatory Visit: Payer: Self-pay

## 2020-04-01 ENCOUNTER — Encounter: Payer: Self-pay | Admitting: Sports Medicine

## 2020-04-01 DIAGNOSIS — M7552 Bursitis of left shoulder: Secondary | ICD-10-CM | POA: Diagnosis not present

## 2020-04-01 NOTE — Assessment & Plan Note (Addendum)
Franklin Bowman returns, he has a subacromial bursitis, improved considerably after subacromial injection with guidance. I really do think he still under rehab, he has a bit of discomfort in the mornings, worse with abduction, and a positive empty can sign today. This is thus not at goal. I am going to print out his rehab again, I would like to set of baseline x-rays today, he will switch his meloxicam to dinnertime so that he is looser in the morning. Return to see me in a month, we will pull the trigger for formal physical therapy if no better at that time.

## 2020-04-01 NOTE — Progress Notes (Addendum)
    Procedures performed today:    None.  Independent interpretation of notes and tests performed by another provider:   X-rays personally reviewed and are unremarkable.  Brief History, Exam, Impression, and Recommendations:    Acute shoulder bursitis, left Franklin Bowman returns, he has a subacromial bursitis, improved considerably after subacromial injection with guidance. I really do think he still under rehab, he has a bit of discomfort in the mornings, worse with abduction, and a positive empty can sign today. This is thus not at goal. I am going to print out his rehab again, I would like to set of baseline x-rays today, he will switch his meloxicam to dinnertime so that he is looser in the morning. Return to see me in a month, we will pull the trigger for formal physical therapy if no better at that time.    ___________________________________________ Franklin Bowman. Franklin Bowman, M.D., ABFM., CAQSM. Primary Care and Sports Medicine [facility]  Adjunct Instructor of Family Medicine  [employer]

## 2020-04-01 NOTE — Addendum Note (Signed)
Addended by: Monica Becton on: 04/01/2020 09:33 AM   Modules accepted: Level of Service

## 2020-04-29 ENCOUNTER — Telehealth (INDEPENDENT_AMBULATORY_CARE_PROVIDER_SITE_OTHER): Payer: Commercial Managed Care - PPO | Admitting: Sports Medicine

## 2020-04-29 ENCOUNTER — Other Ambulatory Visit: Payer: Self-pay

## 2020-04-29 DIAGNOSIS — M7552 Bursitis of left shoulder: Secondary | ICD-10-CM
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
## 2020-05-24 ENCOUNTER — Encounter: Payer: Self-pay | Admitting: Emergency Medicine

## 2020-05-24 ENCOUNTER — Emergency Department (INDEPENDENT_AMBULATORY_CARE_PROVIDER_SITE_OTHER)
Admission: EM | Admit: 2020-05-24 | Discharge: 2020-05-24 | Disposition: A | Discharge status: Home / Self Care | Payer: Commercial Managed Care - PPO | Source: Home / Self Care

## 2020-05-24 ENCOUNTER — Other Ambulatory Visit: Payer: Self-pay

## 2020-05-24 DIAGNOSIS — H6993 Unspecified Eustachian tube disorder, bilateral: Secondary | ICD-10-CM | POA: Diagnosis not present

## 2020-05-24 DIAGNOSIS — Z23 Encounter for immunization: Secondary | ICD-10-CM | POA: Diagnosis not present

## 2020-05-24 DIAGNOSIS — B9789 Other viral agents as the cause of diseases classified elsewhere: Secondary | ICD-10-CM

## 2020-05-24 MED ORDER — CETIRIZINE HCL 10 MG PO TABS: 10.0000 mg | ORAL_TABLET | Freq: Every day | ORAL | 0 refills | Status: AC

## 2020-05-24 MED ORDER — IPRATROPIUM BROMIDE 0.03 % NA SOLN: 2.0000 | Freq: Two times a day (BID) | NASAL | 0 refills | Status: DC

## 2020-05-24 MED ORDER — PREDNISONE 20 MG PO TABS: 20.0000 mg | ORAL_TABLET | Freq: Every day | ORAL | 0 refills | Status: AC

## 2020-05-24 NOTE — ED Provider Notes (Signed)
[facility]    CSN: [id number] Arrival date & time: 05/24/20  1601      History   Chief Complaint Chief Complaint  Patient presents with  . Ear Problem    HPI Franklin Bowman is a 19 y.o. male.   HPI  Patient presents with symptoms of right ear pain, sore throat, and congestion x 1 week. Denies any sick contacts and refuses COVID testing. Afebrile. History of seasonal allergies. No shortness of breath or chest pain. Past Medical History:  Diagnosis Date  . ADHD (attention deficit hyperactivity disorder)   . History of frequent ear infections     Patient Active Problem List   Diagnosis Date Noted  . Acute shoulder bursitis, left 02/18/2020  . Carpal tunnel syndrome on right 07/18/2015  . Sinus tachycardia 07/18/2015    Past Surgical History:  Procedure Laterality Date  . TONSILLECTOMY    . TYMPANOSTOMY TUBE PLACEMENT Bilateral        Home Medications    Prior to Admission medications   Medication Sig Start Date End Date Taking? Authorizing Provider  cetirizine (ZYRTEC) 10 MG tablet Take by mouth.    [provider]  meloxicam (MOBIC) 15 MG tablet Take 1 tablet (15 mg total) by mouth daily. Patient taking differently: Take 15 mg by mouth 3 times/day as needed-between meals & bedtime.  01/08/20   Lurene Shadow, PA-C    Family History Family History  Problem Relation Age of Onset  . Healthy Mother   . Healthy Father   . Healthy Sister     Social History Social History   Tobacco Use  . Smoking status: Never Smoker  . Smokeless tobacco: Never Used  Vaping Use  . Vaping Use: Never used  Substance Use Topics  . Alcohol use: No  . Drug use: Never     Allergies   Labetalol hcl   Review of Systems Review of Systems Pertinent negatives listed in HPI   Physical Exam Triage Vital Signs ED Triage Vitals  Enc Vitals Group     BP 05/24/20 1644 (!) 154/80     Pulse Rate 05/24/20 1644 100     Resp 05/24/20 1644 16     Temp  05/24/20 1644 97.8 F (36.6 C)     Temp Source 05/24/20 1644 Oral     SpO2 05/24/20 1644 98 %     Weight 05/24/20 1645 250 lb (113.4 kg)     Height 05/24/20 1645 5\' 10"  (1.778 m)     Head Circumference --      Peak Flow --      Pain Score 05/24/20 1645 3     Pain Loc --      Pain Edu? --      Excl. in GC? --    No data found.  Updated Vital Signs BP (!) 154/80 (BP Location: Right Arm)   Pulse 100   Temp 97.8 F (36.6 C) (Oral)   Resp 16   Ht 5\' 10"  (1.778 m)   Wt 250 lb (113.4 kg)   SpO2 98%   BMI 35.87 kg/m   Visual Acuity Right Eye Distance:   Left Eye Distance:   Bilateral Distance:    Right Eye Near:   Left Eye Near:    Bilateral Near:     Physical Exam General appearance: alert, well developed, well nourished, cooperative and in no distress Head: Normocephalic, without obvious abnormality, atraumatic ENT: bilateral ear TM normal, congestion present, oropharynx normal  Respiratory:  Respirations even and unlabored, normal respiratory rate Heart: rate and rhythm normal. No gallop or murmurs noted on exam  Abdomen: BS +, no distention, no rebound tenderness, or no mass Extremities: No gross deformities Skin: Skin color, texture, turgor normal. No rashes seen  Psych: Appropriate mood and affect. Neurologic: Alert, oriented to person, place, and time, thought content appropriate.   UC Treatments / Results  Labs (all labs ordered are listed, but only abnormal results are displayed) Labs Reviewed - No data to display  EKG   Radiology No results found.  Procedures Procedures (including critical care time)  Medications Ordered in UC Medications - No data to display  Initial Impression / Assessment and Plan / UC Course  I have reviewed the triage vital signs and the nursing notes.  Pertinent labs & imaging results that were available during my care of the patient were reviewed by me and considered in my medical decision making (see chart for  details).      Given current COVID-19 pandemic, testing was recommended however, patient declined. Treating as viral symptoms. See discharge medication orders. An After Visit Summary was printed and given to the patient/family. Precautions discussed. Red flags discussed. Questions invited and answered. They voiced understanding and agreement.  Final Clinical Impressions(s) / UC Diagnoses   Final diagnoses:  Eustachian tube disorder, bilateral  Viral sore throat   Discharge Instructions   None    ED Prescriptions    Medication Sig Dispense Auth. Provider   cetirizine (ZYRTEC) 10 MG tablet Take 1 tablet (10 mg total) by mouth at bedtime. 30 tablet Bing Neighbors, FNP   predniSONE (DELTASONE) 20 MG tablet Take 1 tablet (20 mg total) by mouth daily with breakfast for 5 days. 5 tablet Bing Neighbors, FNP   ipratropium (ATROVENT) 0.03 % nasal spray Place 2 sprays into both nostrils 2 (two) times daily. 30 mL Bing Neighbors, FNP     PDMP not reviewed this encounter.   Bing Neighbors, FNP 05/28/20 1742

## 2020-05-24 NOTE — ED Triage Notes (Signed)
Patient reports bilateral ear pain in both ears for about a week; no fever. Has not had covid vaccination.

## 2020-06-15 ENCOUNTER — Other Ambulatory Visit: Payer: Self-pay | Admitting: Family Medicine

## 2020-06-15 ENCOUNTER — Encounter: Payer: Self-pay | Admitting: Family Medicine

## 2020-06-15 NOTE — Telephone Encounter (Signed)
Rerouting to provider Dr Joaquin Courts.

## 2020-06-15 NOTE — Telephone Encounter (Signed)
This encounter was created in error - please disregard.

## 2020-06-15 NOTE — Telephone Encounter (Signed)
Rerouting to Dr Tiburcio Pea.

## 2020-07-21 MED ORDER — MELOXICAM 15 MG PO TABS: ORAL_TABLET | ORAL | 3 refills | Status: AC

## 2021-02-27 IMAGING — DX DG SHOULDER 2+V*L*
3 series · 3 of 3 positions shown · non-contrast
Comparison: None.

CLINICAL DATA: One month of shoulder pain

EXAM:
LEFT SHOULDER - 2+ VIEW

[shoulder grashey]
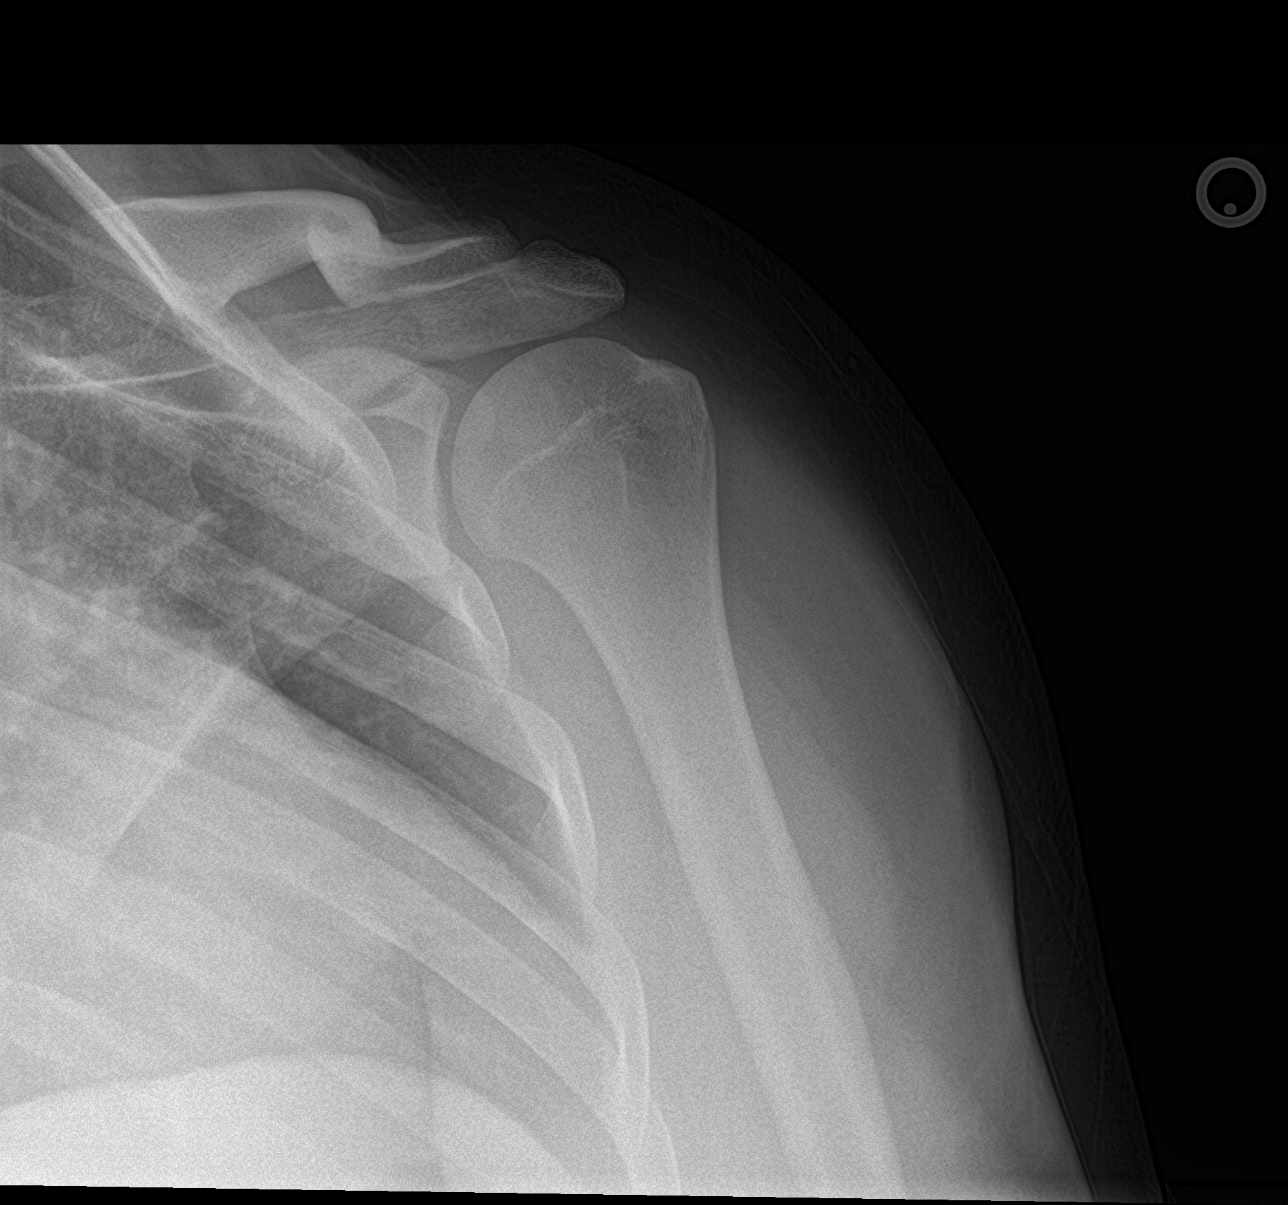

[shoulder y view]
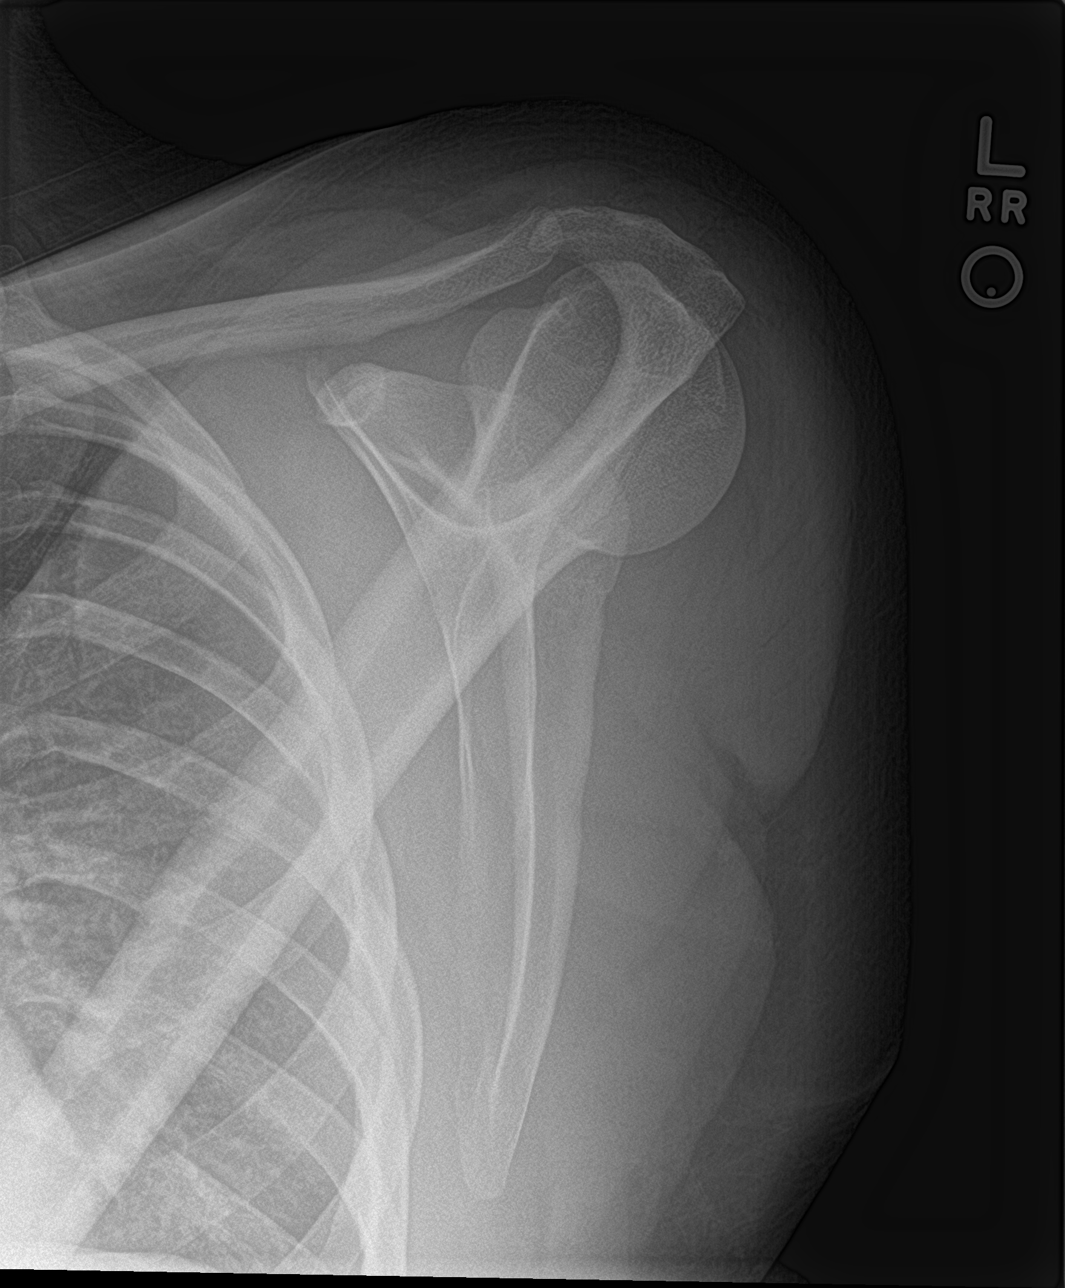

[shoulder axillary]
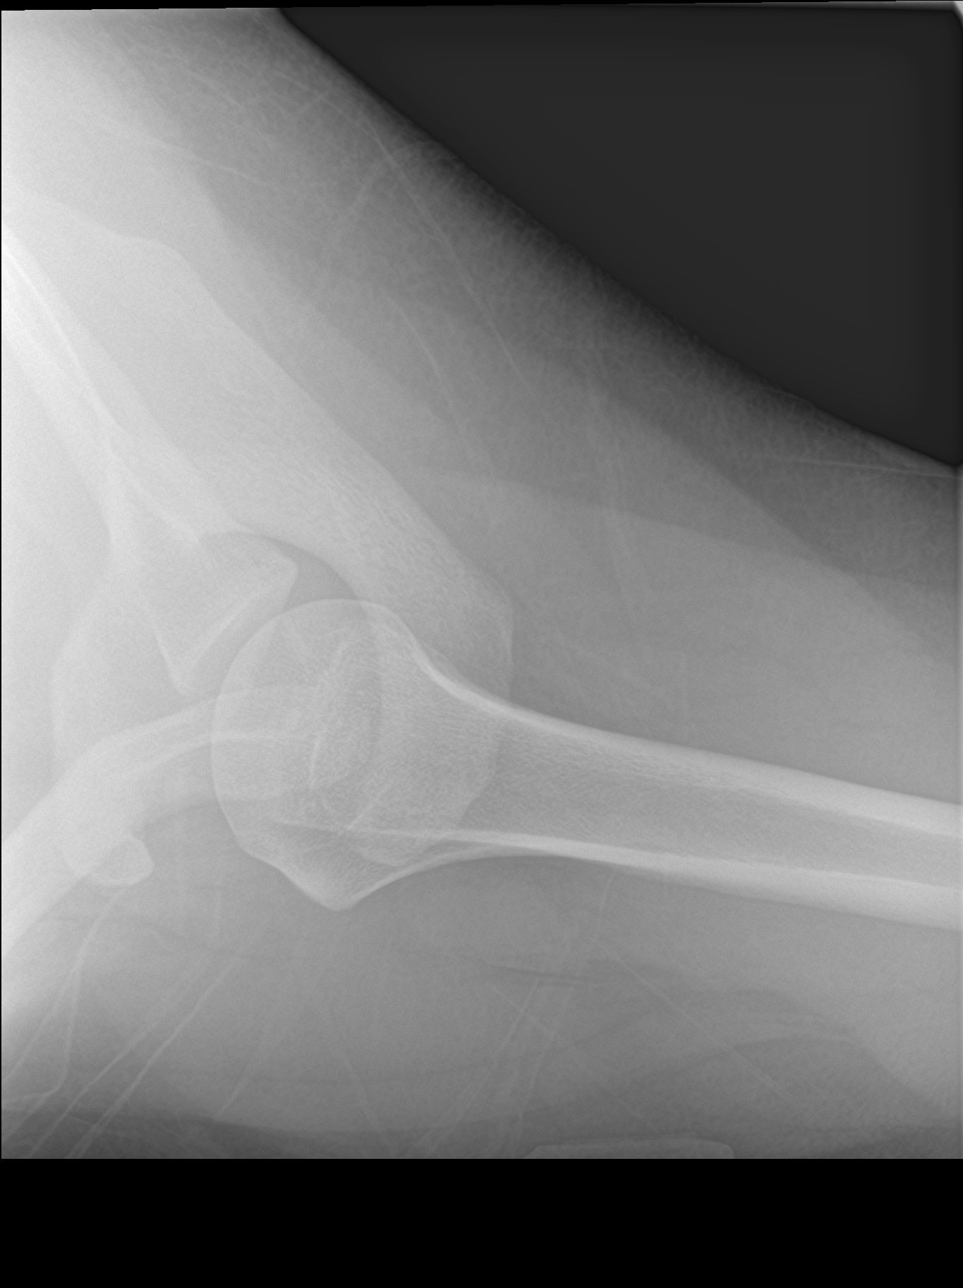

[3 of 3 positions shown; findings below may reference images not displayed]

FINDINGS: There is no evidence of fracture or dislocation. There is no
evidence of arthropathy or other focal bone abnormality. Soft
tissues are unremarkable.
IMPRESSION: Negative.
